# Patient Record
Sex: Male | Born: 1975 | Race: White | Hispanic: No | Marital: Single | Smoking: Never smoker
Health system: Southern US, Community
[De-identification: ages and names within clinical notes are randomized; demographics above are authoritative.]

---

## 2016-02-09 ENCOUNTER — Other Ambulatory Visit: Payer: Self-pay | Admitting: Nurse Practitioner

## 2016-02-09 DIAGNOSIS — E118 Type 2 diabetes mellitus with unspecified complications: Secondary | ICD-10-CM

## 2016-02-09 DIAGNOSIS — F1020 Alcohol dependence, uncomplicated: Secondary | ICD-10-CM

## 2016-02-09 DIAGNOSIS — R748 Abnormal levels of other serum enzymes: Secondary | ICD-10-CM

## 2016-02-09 DIAGNOSIS — K760 Fatty (change of) liver, not elsewhere classified: Secondary | ICD-10-CM

## 2016-02-13 ENCOUNTER — Ambulatory Visit
Admission: RE | Admit: 2016-02-13 | Discharge: 2016-02-13 | Disposition: A | Discharge status: Home / Self Care | Payer: Managed Care, Other (non HMO) | Source: Ambulatory Visit | Attending: Nurse Practitioner | Admitting: Nurse Practitioner

## 2016-02-13 DIAGNOSIS — E119 Type 2 diabetes mellitus without complications: Secondary | ICD-10-CM | POA: Diagnosis not present

## 2016-02-13 DIAGNOSIS — E118 Type 2 diabetes mellitus with unspecified complications: Secondary | ICD-10-CM

## 2016-02-13 DIAGNOSIS — F1020 Alcohol dependence, uncomplicated: Secondary | ICD-10-CM | POA: Insufficient documentation

## 2016-02-13 DIAGNOSIS — K760 Fatty (change of) liver, not elsewhere classified: Secondary | ICD-10-CM | POA: Diagnosis present

## 2016-02-13 DIAGNOSIS — R748 Abnormal levels of other serum enzymes: Secondary | ICD-10-CM | POA: Insufficient documentation

## 2017-10-29 ENCOUNTER — Other Ambulatory Visit (HOSPITAL_COMMUNITY): Payer: Self-pay | Admitting: Gastroenterology

## 2017-10-29 ENCOUNTER — Other Ambulatory Visit: Payer: Self-pay | Admitting: Gastroenterology

## 2017-10-29 DIAGNOSIS — R945 Abnormal results of liver function studies: Principal | ICD-10-CM

## 2017-10-29 DIAGNOSIS — R7989 Other specified abnormal findings of blood chemistry: Secondary | ICD-10-CM

## 2017-11-04 ENCOUNTER — Ambulatory Visit: Payer: BLUE CROSS/BLUE SHIELD

## 2017-11-04 ENCOUNTER — Ambulatory Visit: Payer: Managed Care, Other (non HMO)

## 2019-09-02 ENCOUNTER — Other Ambulatory Visit: Payer: Self-pay | Admitting: Nurse Practitioner

## 2019-09-02 DIAGNOSIS — K760 Fatty (change of) liver, not elsewhere classified: Secondary | ICD-10-CM

## 2019-09-02 DIAGNOSIS — F1010 Alcohol abuse, uncomplicated: Secondary | ICD-10-CM

## 2019-09-07 ENCOUNTER — Ambulatory Visit: Payer: BLUE CROSS/BLUE SHIELD

## 2019-10-06 ENCOUNTER — Emergency Department: Payer: 59

## 2019-10-06 ENCOUNTER — Encounter: Payer: Self-pay | Admitting: *Deleted

## 2019-10-06 ENCOUNTER — Other Ambulatory Visit: Payer: Self-pay

## 2019-10-06 DIAGNOSIS — R55 Syncope and collapse: Secondary | ICD-10-CM | POA: Insufficient documentation

## 2019-10-06 DIAGNOSIS — F10929 Alcohol use, unspecified with intoxication, unspecified: Secondary | ICD-10-CM | POA: Insufficient documentation

## 2019-10-06 DIAGNOSIS — T43011A Poisoning by tricyclic antidepressants, accidental (unintentional), initial encounter: Secondary | ICD-10-CM | POA: Insufficient documentation

## 2019-10-06 DIAGNOSIS — T4271XA Poisoning by unspecified antiepileptic and sedative-hypnotic drugs, accidental (unintentional), initial encounter: Secondary | ICD-10-CM | POA: Diagnosis not present

## 2019-10-06 LAB — BASIC METABOLIC PANEL
Anion gap: 20 — ABNORMAL HIGH (ref 5–15)
BUN: 10 mg/dL (ref 6–20)
CO2: 24 mmol/L (ref 22–32)
Calcium: 9.4 mg/dL (ref 8.9–10.3)
Chloride: 84 mmol/L — ABNORMAL LOW (ref 98–111)
Creatinine, Ser: 1.03 mg/dL (ref 0.61–1.24)
GFR calc Af Amer: >60 mL/min (ref >60)
GFR calc non Af Amer: >60 mL/min (ref >60)
Glucose, Bld: 151 mg/dL — ABNORMAL HIGH (ref 70–99)
Potassium: 3.9 mmol/L (ref 3.5–5.1)
Sodium: 128 mmol/L — ABNORMAL LOW (ref 135–145)

## 2019-10-06 LAB — CBC
HCT: 34.9 % — ABNORMAL LOW (ref 39.0–52.0)
Hemoglobin: 12.2 g/dL — ABNORMAL LOW (ref 13.0–17.0)
MCH: 33.2 pg (ref 26.0–34.0)
MCHC: 35 g/dL (ref 30.0–36.0)
MCV: 94.8 fL (ref 80.0–100.0)
Platelets: 283 10*3/uL (ref 150–400)
RBC: 3.68 MIL/uL — ABNORMAL LOW (ref 4.22–5.81)
RDW: 12.2 % (ref 11.5–15.5)
WBC: 11.2 10*3/uL — ABNORMAL HIGH (ref 4.0–10.5)
nRBC: 0 % (ref 0.0–0.2)

## 2019-10-06 LAB — URINALYSIS, COMPLETE (UACMP) WITH MICROSCOPIC
Bacteria, UA: NONE SEEN
Bilirubin Urine: NEGATIVE
Glucose, UA: NEGATIVE mg/dL
Hgb urine dipstick: NEGATIVE
Ketones, ur: 20 mg/dL — AB
Leukocytes,Ua: NEGATIVE
Nitrite: NEGATIVE
Protein, ur: 30 mg/dL — AB
Specific Gravity, Urine: 1.011 (ref 1.005–1.030)
Squamous Epithelial / HPF: NONE SEEN (ref 0–5)
pH: 5 (ref 5.0–8.0)

## 2019-10-06 LAB — TROPONIN I (HIGH SENSITIVITY): Troponin I (High Sensitivity): 2 ng/L (ref <18)

## 2019-10-06 LAB — GLUCOSE, CAPILLARY: Glucose-Capillary: 124 mg/dL — ABNORMAL HIGH (ref 70–99)

## 2019-10-06 NOTE — ED Triage Notes (Signed)
Pt brought in via ems from home.  Pt had a syncopal episode, passed and fell down steps outside.  Diabetic, etoh use every day.  Pt in ccollar.  Abrasions to knees.  Pt denies neck or back pain.  No headache.  Pt alert  Speech clear.

## 2019-10-06 NOTE — ED Notes (Signed)
fsbs 124 in triage.

## 2019-10-06 NOTE — ED Triage Notes (Addendum)
First Nurse Note:  Patient coming [facility] from home for 10+ steps. Patient passed out. Patient found by neighbor. Patient has hx of diabetes and neuropathy. Patent has C-Collar in place. patient has abrasions to neck, right shoulder, right elbow. ETOH on board. Patient just started taking Lyrica. Patient had syncopal episode as well.   IV in left AC.   EMS vitals: CBG 128, 126/80, 100% on RA, HR 110-115.

## 2019-10-07 ENCOUNTER — Emergency Department
Admission: EM | Admit: 2019-10-07 | Discharge: 2019-10-07 | Disposition: A | Discharge status: Home / Self Care | Payer: 59 | Attending: Emergency Medicine | Admitting: Emergency Medicine

## 2019-10-07 DIAGNOSIS — R55 Syncope and collapse: Secondary | ICD-10-CM

## 2019-10-07 MED ORDER — SODIUM CHLORIDE 0.9 % IV BOLUS
1000.0000 mL | Freq: Once | INTRAVENOUS | Status: AC
  Administered 2019-10-07: 02:00:00 1000 mL via INTRAVENOUS

## 2019-10-07 NOTE — ED Notes (Addendum)
Pt uprite on stretcher in exam room with no distress noted; pt reports syncopal episode PTA leading to fall down steps; +ETOH, recently rx lyrica and cymbalta; c-collar has been removed by MD; st some neck pain at present due to the c-collar being worn

## 2019-10-07 NOTE — ED Notes (Signed)
Pt given sandwich tray and sprite.  

## 2019-10-07 NOTE — ED Provider Notes (Addendum)
[facility] Emergency Department Provider Note  ____________________________________________   First MD Initiated Contact with Patient 10/07/19 0119     (approximate)  I have reviewed the triage vital signs and the nursing notes.   HISTORY  Chief Complaint Loss of Consciousness   HPI Earl Buchanan is a 44 y.o. male with history of alcohol abuse, diabetes presents to the emergency department secondary to unwitnessed fall patient found at the bottom of 10 steps by his neighbor.  Patient denies any discomfort at present.  Patient does admit to EtOH ingestion today.  Patient does admit to being recently prescribed Lyrica and Cymbalta and notes dizziness since the onset of taking those medications.  Patient also admits that he has continued to drink while taking the beforementioned medications.  Patient denies any current weakness numbness gait instability or visual changes.        No past medical history on file.  There are no problems to display for this patient.     Prior to Admission medications   Not on File    Allergies Patient has no known allergies.  No family history on file.  Social History Social History   Tobacco Use  . Smoking status: Never Smoker  . Smokeless tobacco: Never Used  Substance Use Topics  . Alcohol use: Yes  . Drug use: Not on file    Review of Systems Constitutional: No fever/chills Eyes: No visual changes. ENT: No sore throat. Cardiovascular: Denies chest pain. Respiratory: Denies shortness of breath. Gastrointestinal: No abdominal pain.  No nausea, no vomiting.  No diarrhea.  No constipation. Genitourinary: Negative for dysuria. Musculoskeletal: Negative for neck pain.  Negative for back pain. Integumentary: Negative for rash. Neurological: Negative for headaches, focal weakness or numbness. Psychiatric: Positive for heavy EtOH ingestion ____________________________________________   PHYSICAL  EXAM:  VITAL SIGNS: ED Triage Vitals [10/06/19 1952]  Enc Vitals Group     BP 122/81     Pulse Rate (!) 127     Resp 18     Temp 98.2 F (36.8 C)     Temp Source Oral     SpO2 100 %     Weight 78.5 kg (173 lb)     Height 1.778 m (5\' 10" )     Head Circumference      Peak Flow      Pain Score 0     Pain Loc      Pain Edu?      Excl. in GC?     Constitutional: Alert and oriented.  Eyes: Conjunctivae are normal.  Head: Atraumatic. Mouth/Throat: Patient is wearing a mask. Neck: No stridor.  No meningeal signs.   Cardiovascular: Normal rate, regular rhythm. Good peripheral circulation. Grossly normal heart sounds. Respiratory: Normal respiratory effort.  No retractions. Gastrointestinal: Soft and nontender. No distention.  Musculoskeletal: No lower extremity tenderness nor edema. No gross deformities of extremities. Neurologic:  Normal speech and language. No gross focal neurologic deficits are appreciated.  Skin:  Skin is warm, dry and intact. Psychiatric: Mood and affect are normal. Speech and behavior are normal.  ____________________________________________   LABS (all labs ordered are listed, but only abnormal results are displayed)  Labs Reviewed  BASIC METABOLIC PANEL - Abnormal; Notable for the following components:      Result Value   Sodium 128 (*)    Chloride 84 (*)    Glucose, Bld 151 (*)    Anion gap 20 (*)    All other components within normal  limits  CBC - Abnormal; Notable for the following components:   WBC 11.2 (*)    RBC 3.68 (*)    Hemoglobin 12.2 (*)    HCT 34.9 (*)    All other components within normal limits  URINALYSIS, COMPLETE (UACMP) WITH MICROSCOPIC - Abnormal; Notable for the following components:   Color, Urine YELLOW (*)    APPearance CLEAR (*)    Ketones, ur 20 (*)    Protein, ur 30 (*)    All other components within normal limits  GLUCOSE, CAPILLARY - Abnormal; Notable for the following components:   Glucose-Capillary 124 (*)     All other components within normal limits  CBG MONITORING, ED  TROPONIN I (HIGH SENSITIVITY)  TROPONIN I (HIGH SENSITIVITY)   ____________________________________________  EKG  ED ECG REPORT I, [place] N Clarion Mooneyhan, the attending physician, personally viewed and interpreted this ECG.   Date: 10/06/2019  EKG Time: 1:54 PM  Rate: 128  Rhythm: Sinus tachycardia  Axis: Normal  Intervals: Normal  ST&T Change: None  ____________________________________________  RADIOLOGY I, [place] N Linnet Bottari, personally viewed and evaluated these images (plain radiographs) as part of my medical decision making, as well as reviewing the written report by the radiologist.  ED MD interpretation: CT head and cervical spine revealed no acute abnormality.  Patient's chest x-ray also normal.  Official radiology report(s): DG Chest 2 View  Result Date: 10/06/2019 CLINICAL DATA:  Fall, syncope EXAM: CHEST - 2 VIEW COMPARISON:  None FINDINGS: The heart size and mediastinal contours are within normal limits. Both lungs are clear. The visualized skeletal structures are unremarkable. IMPRESSION: Negative Electronically Signed   By: Rolm Baptise M.D.   On: 10/06/2019 21:29   CT Head Wo Contrast  Result Date: 10/06/2019 CLINICAL DATA:  Abrasion to neck, passed out EXAM: CT HEAD WITHOUT CONTRAST CT CERVICAL SPINE WITHOUT CONTRAST TECHNIQUE: Multidetector CT imaging of the head and cervical spine was performed following the standard protocol without intravenous contrast. Multiplanar CT image reconstructions of the cervical spine were also generated. COMPARISON:  None. FINDINGS: CT HEAD FINDINGS Brain: No acute territorial infarction, hemorrhage or intracranial mass. Nonenlarged ventricles. Vascular: No hyperdense vessels.  No unexpected calcification Skull: Normal. Negative for fracture or focal lesion. Sinuses/Orbits: No acute finding. Mild mucosal thickening in the maxillary and ethmoid sinuses Other: None CT CERVICAL SPINE  FINDINGS Alignment: Straightening of the cervical spine. No subluxation. Facet alignment is Skull base and vertebrae: No acute fracture. No primary bone lesion or focal pathologic process. Soft tissues and spinal canal: No prevertebral fluid or swelling. No visible canal hematoma. Disc levels:  Within normal limits. Upper chest: Negative. Other: None IMPRESSION: 1. Negative non contrasted CT appearance of the brain. 2. Straightening of the cervical spine. No acute osseous abnormality Electronically Signed   By: Donavan Foil M.D.   On: 10/06/2019 21:39   CT Cervical Spine Wo Contrast  Result Date: 10/06/2019 CLINICAL DATA:  Abrasion to neck, passed out EXAM: CT HEAD WITHOUT CONTRAST CT CERVICAL SPINE WITHOUT CONTRAST TECHNIQUE: Multidetector CT imaging of the head and cervical spine was performed following the standard protocol without intravenous contrast. Multiplanar CT image reconstructions of the cervical spine were also generated. COMPARISON:  None. FINDINGS: CT HEAD FINDINGS Brain: No acute territorial infarction, hemorrhage or intracranial mass. Nonenlarged ventricles. Vascular: No hyperdense vessels.  No unexpected calcification Skull: Normal. Negative for fracture or focal lesion. Sinuses/Orbits: No acute finding. Mild mucosal thickening in the maxillary and ethmoid sinuses Other: None CT CERVICAL SPINE FINDINGS  Alignment: Straightening of the cervical spine. No subluxation. Facet alignment is Skull base and vertebrae: No acute fracture. No primary bone lesion or focal pathologic process. Soft tissues and spinal canal: No prevertebral fluid or swelling. No visible canal hematoma. Disc levels:  Within normal limits. Upper chest: Negative. Other: None IMPRESSION: 1. Negative non contrasted CT appearance of the brain. 2. Straightening of the cervical spine. No acute osseous abnormality Electronically Signed   By: Jasmine Pang M.D.   On: 10/06/2019 21:39     ____________________________________________    Procedures   ____________________________________________   INITIAL IMPRESSION / MDM / ASSESSMENT AND PLAN / ED COURSE  As part of my medical decision making, I reviewed the following data within the electronic MEDICAL RECORD NUMBER   44 year old male presented with above-stated history and physical exam following syncopal episode most likely secondary to consuming heavy EtOH and taking Lyrica and Cymbalta simultaneously.  I spoke with the patient at length regarding the dangers of doing so.  I advised the patient to follow-up with his primary care provider today and advised them that he is drinking while taking these medications.  Patient given 2 L IV normal saline in the emergency department secondary to clinical signs of dehydration as well as ketones noted in the urine.  Patient states that he had not ate anything since this morning but continue to consume alcohol during the course of the day.  As such patient was given something to eat here in the emergency department as well.       ____________________________________________  FINAL CLINICAL IMPRESSION(S) / ED DIAGNOSES  Final diagnoses:  Syncope, unspecified syncope type     MEDICATIONS GIVEN DURING THIS VISIT:  Medications  sodium chloride 0.9 % bolus 1,000 mL (has no administration in time range)     ED Discharge Orders    None      *Please note:  Bud Kaeser was evaluated in Emergency Department on 10/07/2019 for the symptoms described in the history of present illness. He was evaluated in the context of the global COVID-19 pandemic, which necessitated consideration that the patient might be at risk for infection with the SARS-CoV-2 virus that causes COVID-19. Institutional protocols and algorithms that pertain to the evaluation of patients at risk for COVID-19 are in a state of rapid change based on information released by regulatory bodies including the CDC and  federal and state organizations. These policies and algorithms were followed during the patient's care in the ED.  Some ED evaluations and interventions may be delayed as a result of limited staffing during the pandemic.*  Note:  This document was prepared using Dragon voice recognition software and may include unintentional dictation errors.   Darci Current, MD 10/07/19 0425    Darci Current, MD 10/07/19 [phone]

## 2021-10-20 IMAGING — CT CT HEAD W/O CM
3 series · 15 of 47 positions shown, 18 images · non-contrast
Comparison: None.

CLINICAL DATA: Abrasion to neck, passed out

EXAM:
CT HEAD WITHOUT CONTRAST
CT CERVICAL SPINE WITHOUT CONTRAST
TECHNIQUE: Multidetector CT imaging of the head and cervical spine was
performed following the standard protocol without intravenous
contrast. Multiplanar CT image reconstructions of the cervical spine
were also generated.

[Series 2: head wo · axial · 0.45mm/px · z∈[-79,+51]mm · 9 of 32 slices shown, 12 images]
[im 3/32  brain]
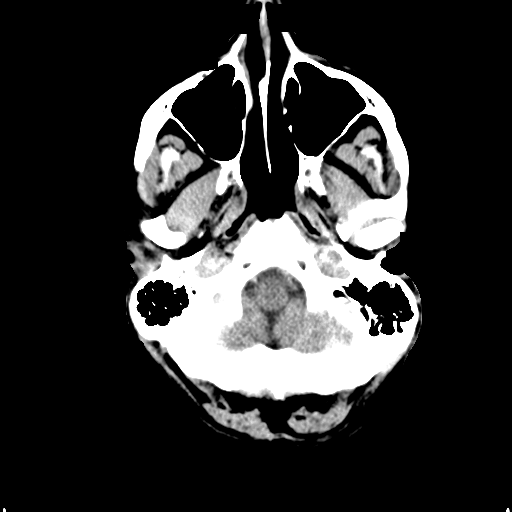
[im 3/32  bone]
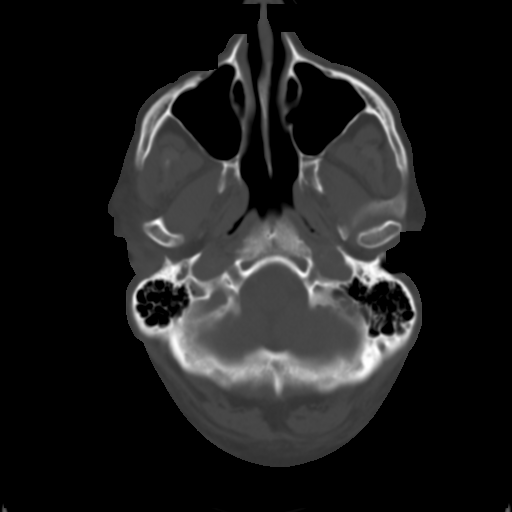
[im 6/32  brain]
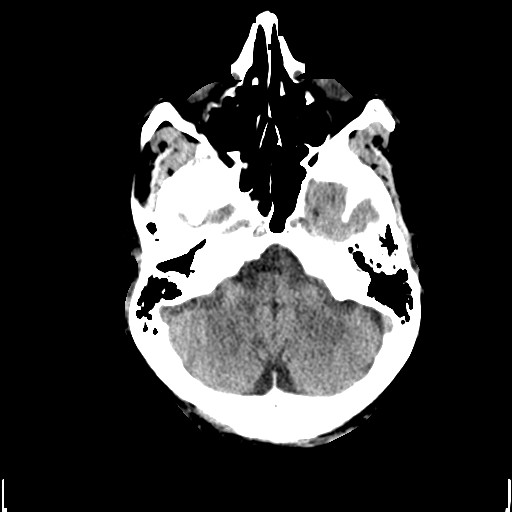
[im 9/32  brain]
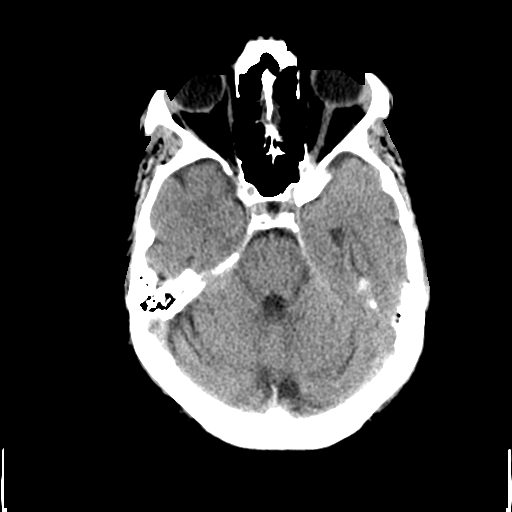
[im 12/32  brain]
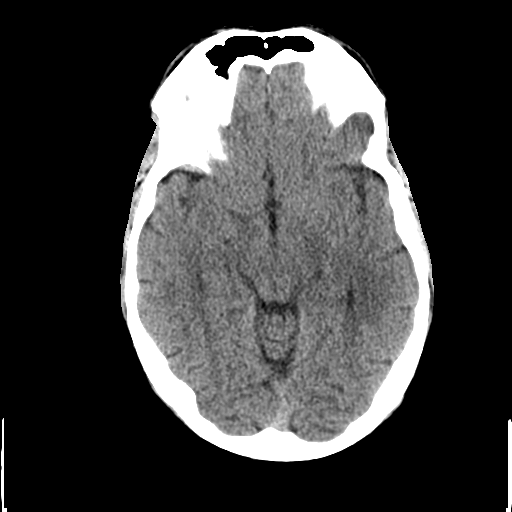
[im 17/32  brain]
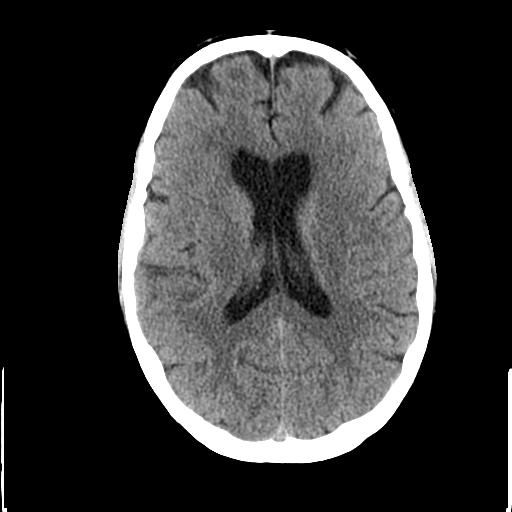
[im 17/32  bone]
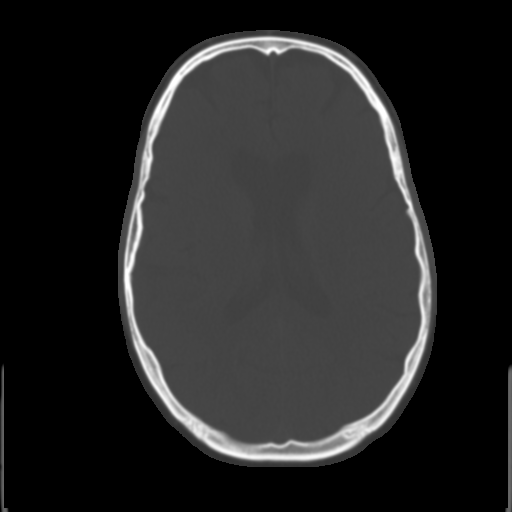
[im 20/32  brain]
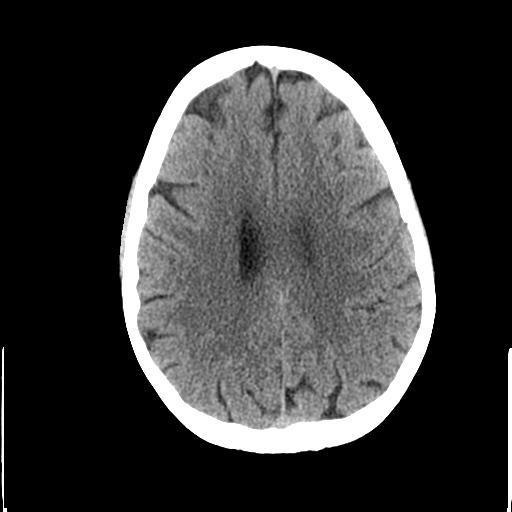
[im 23/32  brain]
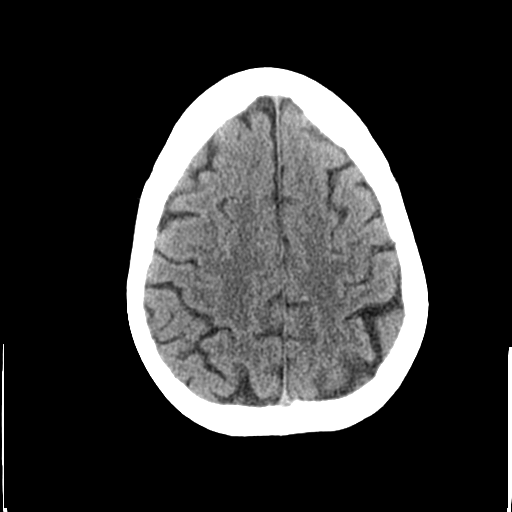
[im 26/32  brain]
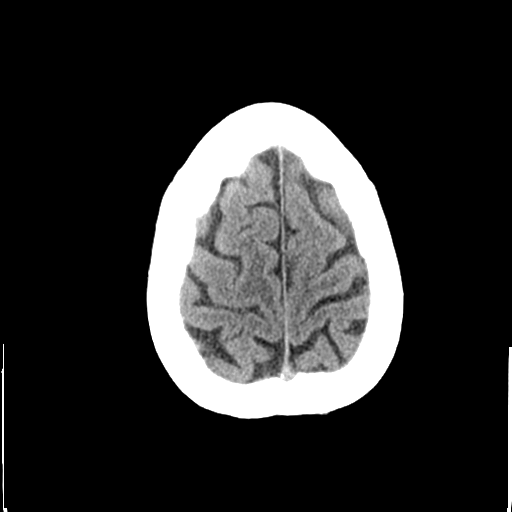
[im 29/32  brain]
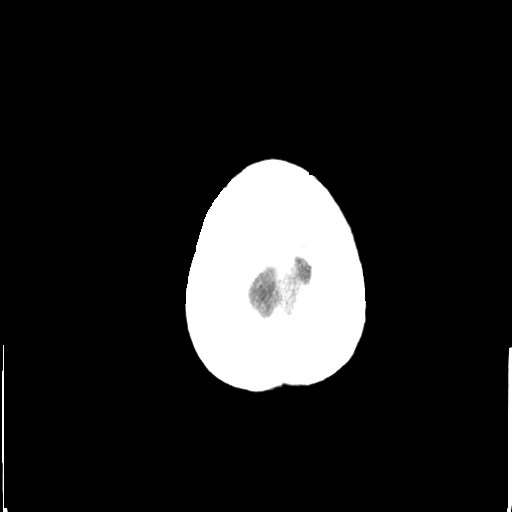
[im 29/32  bone]
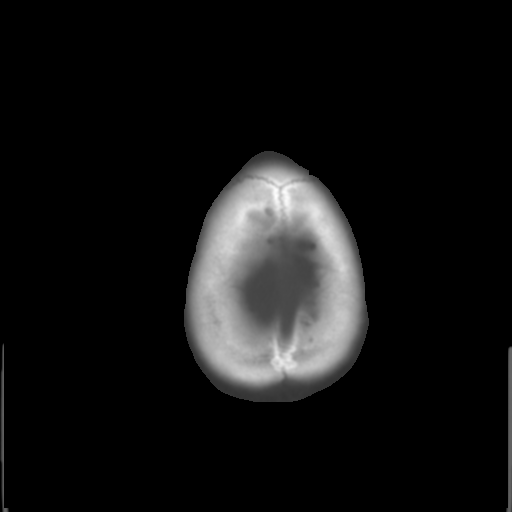

[Series 4: coronal soft tissue · coronal · 0.31mm/px · 3 of 69 slices shown]
[im 23/69  brain]
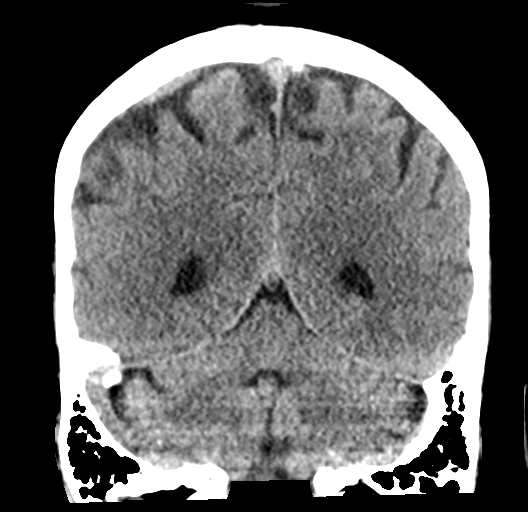
[im 31/69  brain]
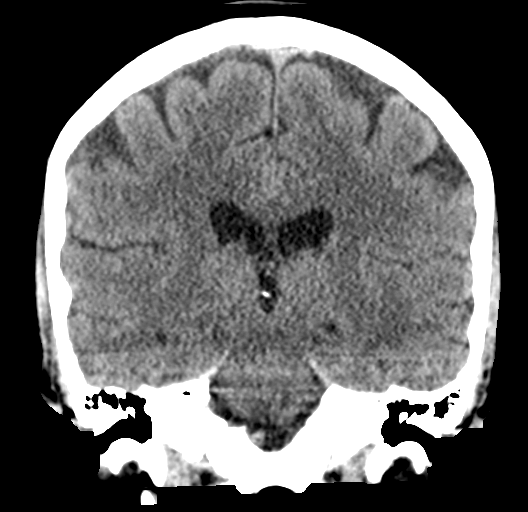
[im 38/69  brain]
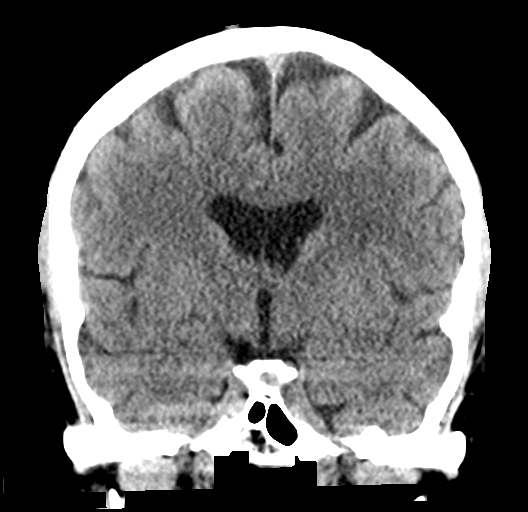

[Series 5: sagittal soft tissue · sagittal · 0.32mm/px · 3 of 52 slices shown]
[im 18/52  brain]
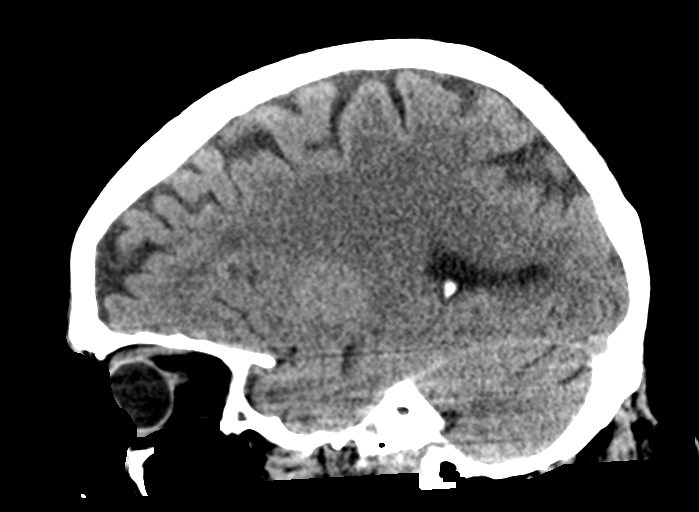
[im 26/52  brain]
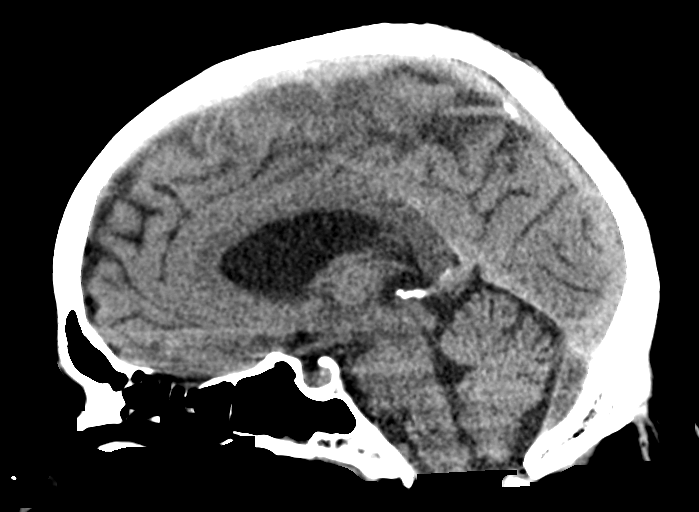
[im 35/52  brain]
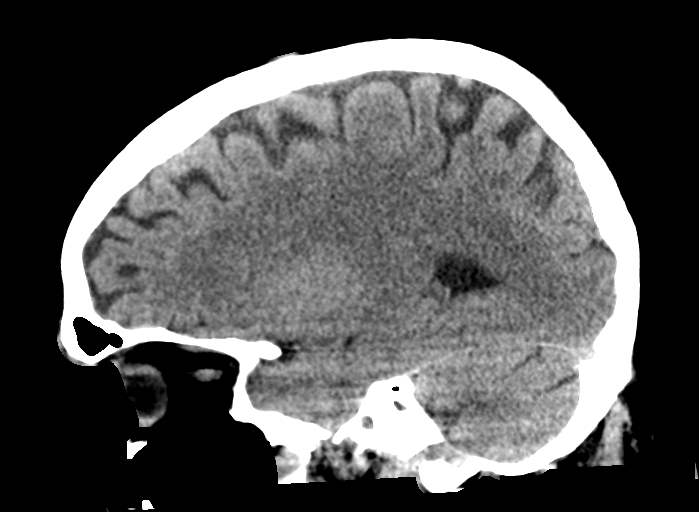

[15 of 47 positions shown; findings below may reference images not displayed]

FINDINGS: CT HEAD FINDINGS

Brain: No acute territorial infarction, hemorrhage or intracranial
mass. Nonenlarged ventricles.

Vascular: No hyperdense vessels.  No unexpected calcification

Skull: Normal. Negative for fracture or focal lesion.

Sinuses/Orbits: No acute finding. Mild mucosal thickening in the
maxillary and ethmoid sinuses

Other: None

CT CERVICAL SPINE FINDINGS

Alignment: Straightening of the cervical spine. No subluxation.
Facet alignment is

Skull base and vertebrae: No acute fracture. No primary bone lesion
or focal pathologic process.

Soft tissues and spinal canal: No prevertebral fluid or swelling. No
visible canal hematoma.

Disc levels:  Within normal limits.

Upper chest: Negative.

Other: None
IMPRESSION: 1. Negative non contrasted CT appearance of the brain.
2. Straightening of the cervical spine. No acute osseous abnormality

## 2021-10-20 IMAGING — CT CT CERVICAL SPINE W/O CM
3 of 4 series · 13 of 33 positions shown, 16 images · non-contrast
Comparison: None.

CLINICAL DATA: Abrasion to neck, passed out

EXAM:
CT HEAD WITHOUT CONTRAST
CT CERVICAL SPINE WITHOUT CONTRAST
TECHNIQUE: Multidetector CT imaging of the head and cervical spine was
performed following the standard protocol without intravenous
contrast. Multiplanar CT image reconstructions of the cervical spine
were also generated.

[Series 4: sagittal bone · sagittal · 0.25mm/px · 5 of 61 slices shown, 6 images]
[im 21/61  bone]
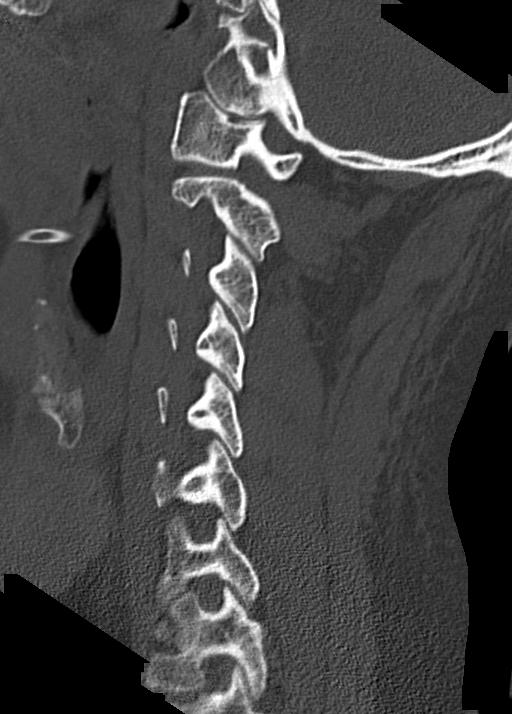
[im 26/61  bone]
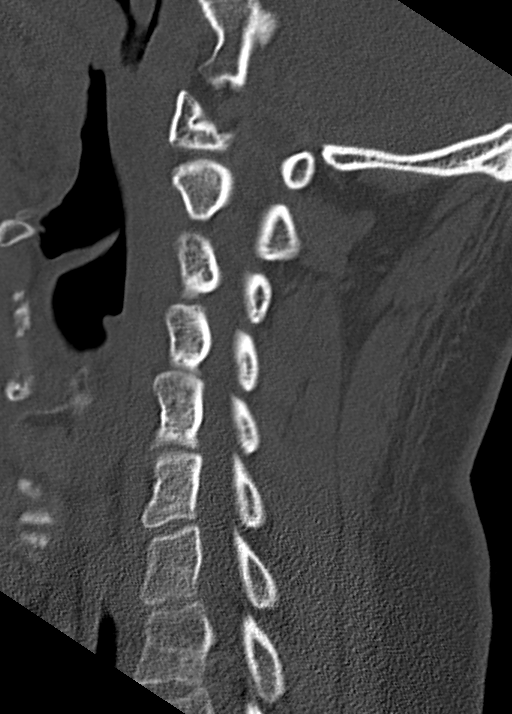
[im 31/61  soft-tissue]
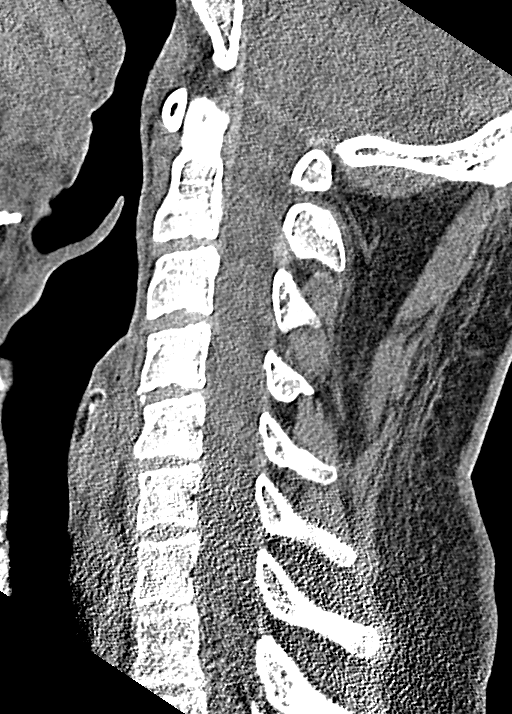
[im 31/61  bone]
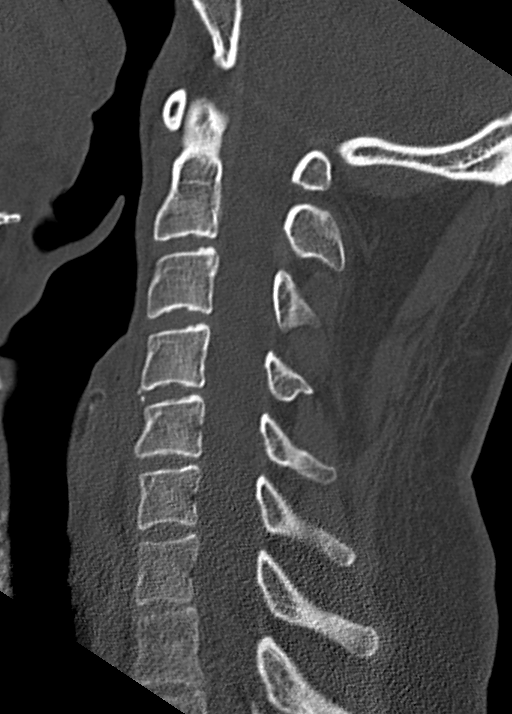
[im 36/61  bone]
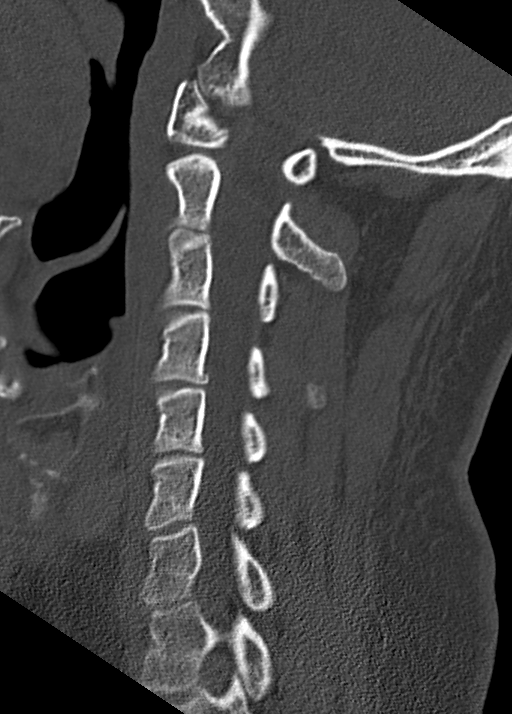
[im 41/61  bone]
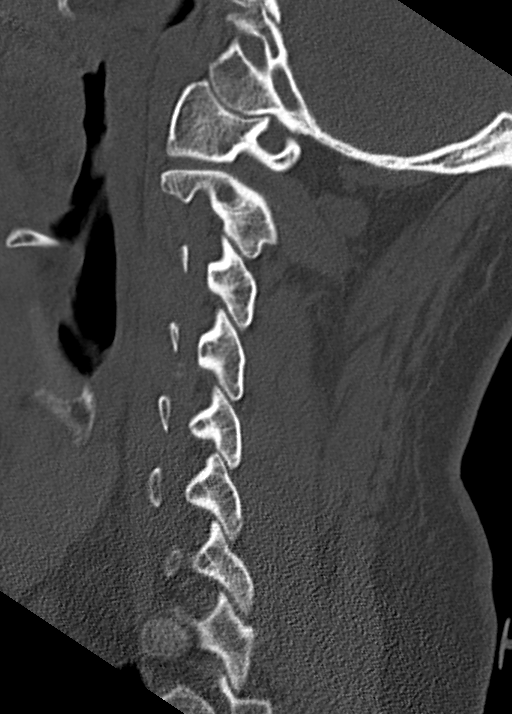

[Series 5: coronal bone · coronal · 0.24mm/px · 3 of 49 slices shown]
[im 10/49  bone]
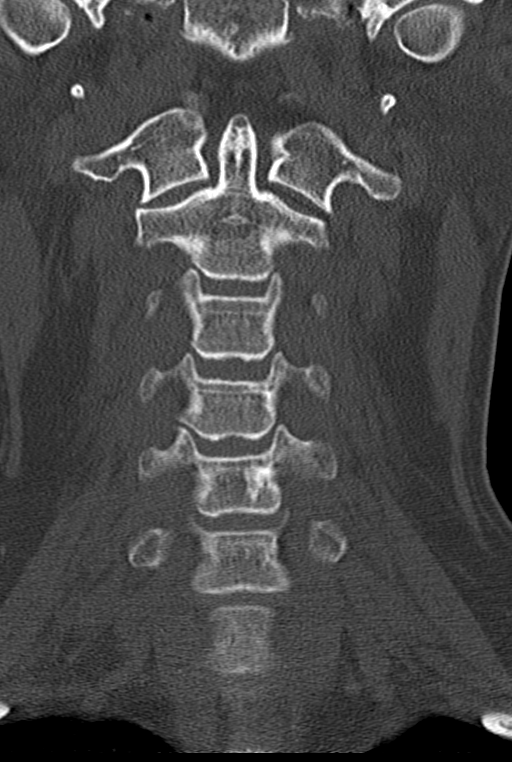
[im 20/49  bone]
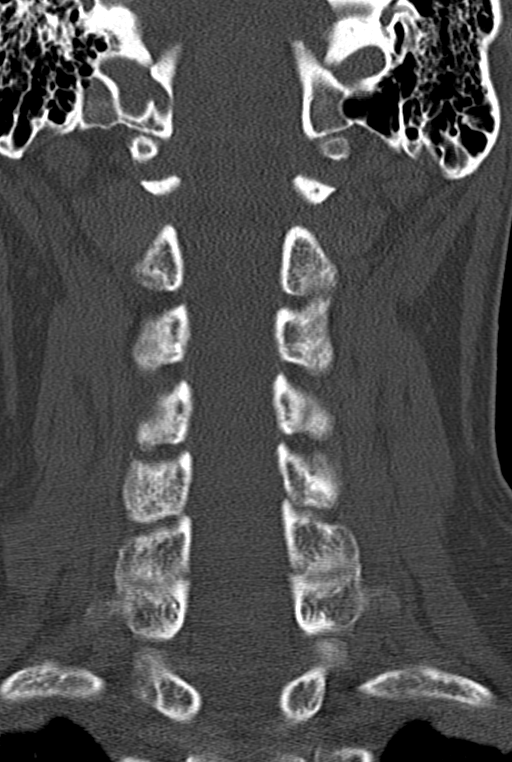
[im 29/49  bone]
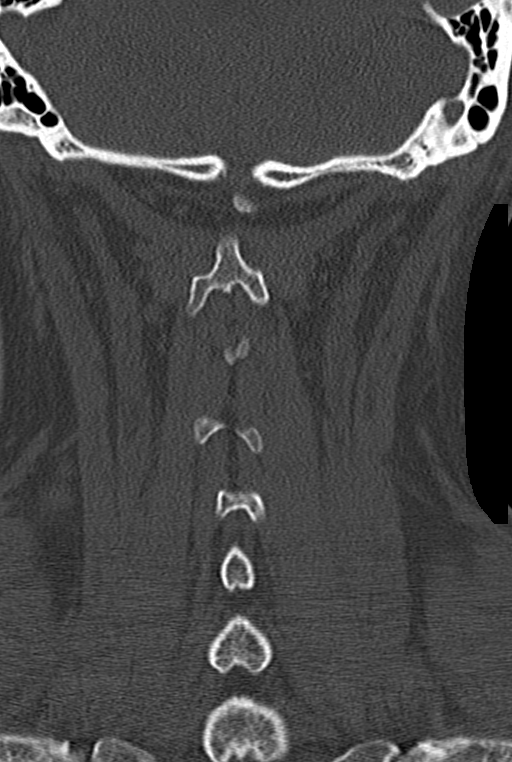

[Series 6: orthogonal bone · axial · 0.23mm/px · z∈[-233,-128]mm · 5 of 89 slices shown, 7 images]
[im 15/89  soft-tissue]
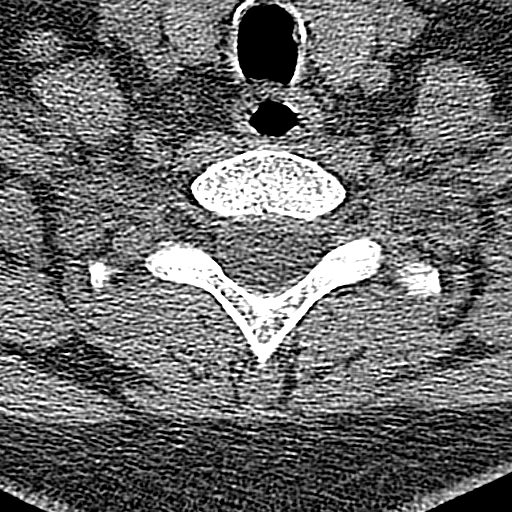
[im 15/89  bone]
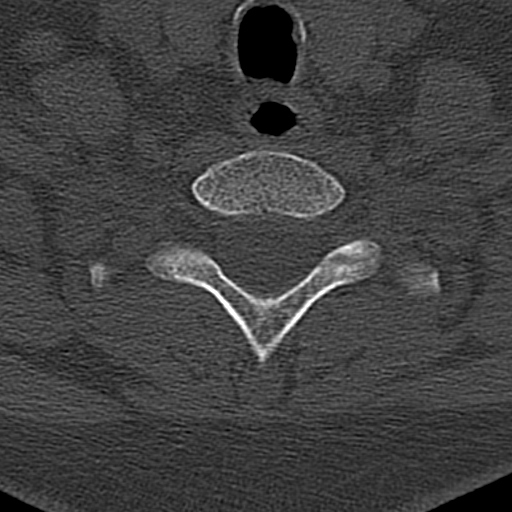
[im 30/89  bone]
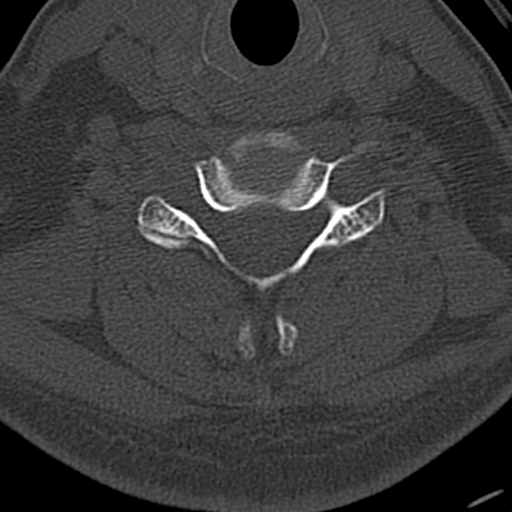
[im 45/89  bone]
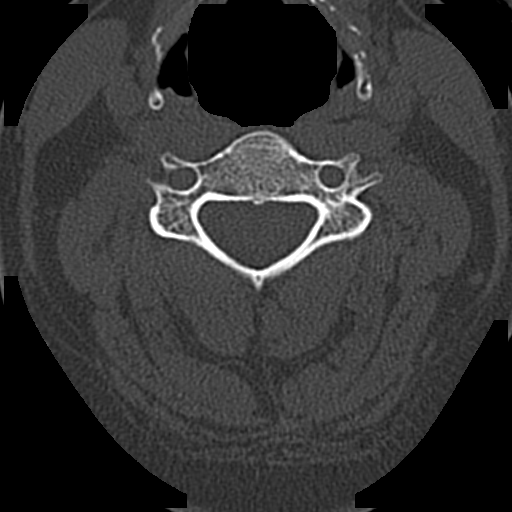
[im 59/89  bone]
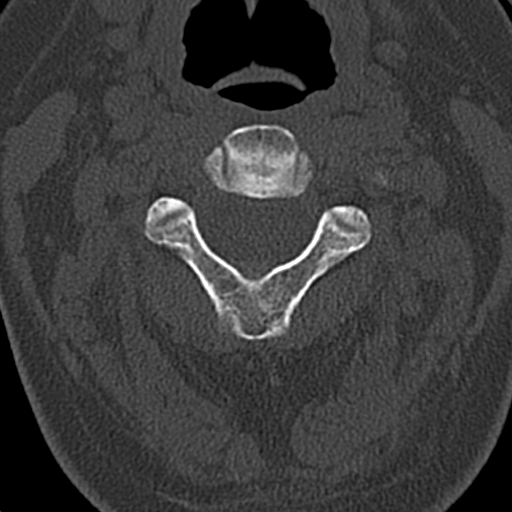
[im 74/89  soft-tissue]
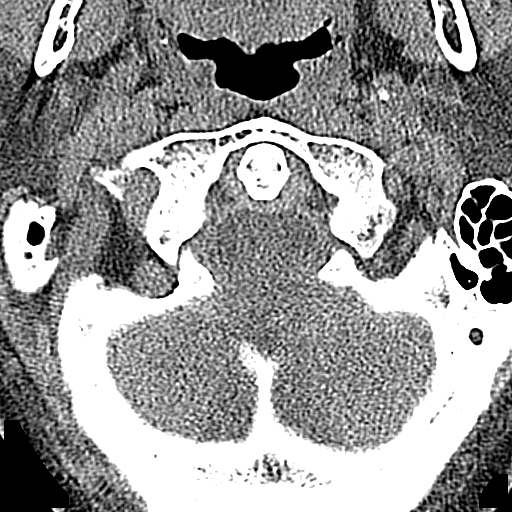
[im 74/89  bone]
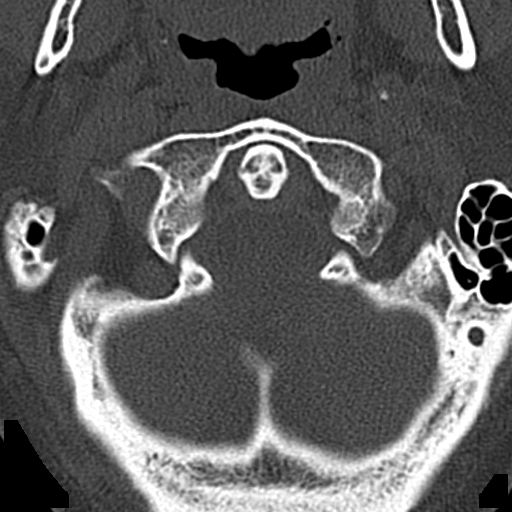

[13 of 33 positions shown; findings below may reference images not displayed]

FINDINGS: CT HEAD FINDINGS

Brain: No acute territorial infarction, hemorrhage or intracranial
mass. Nonenlarged ventricles.

Vascular: No hyperdense vessels.  No unexpected calcification

Skull: Normal. Negative for fracture or focal lesion.

Sinuses/Orbits: No acute finding. Mild mucosal thickening in the
maxillary and ethmoid sinuses

Other: None

CT CERVICAL SPINE FINDINGS

Alignment: Straightening of the cervical spine. No subluxation.
Facet alignment is

Skull base and vertebrae: No acute fracture. No primary bone lesion
or focal pathologic process.

Soft tissues and spinal canal: No prevertebral fluid or swelling. No
visible canal hematoma.

Disc levels:  Within normal limits.

Upper chest: Negative.

Other: None
IMPRESSION: 1. Negative non contrasted CT appearance of the brain.
2. Straightening of the cervical spine. No acute osseous abnormality

## 2022-10-24 DEATH — deceased
